# Patient Record
Sex: Male | Born: 2000 | Race: White | Hispanic: No | Marital: Single | Smoking: Never smoker
Health system: Southern US, Community
[De-identification: ages and names within clinical notes are randomized; demographics above are authoritative.]

---
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
## 2023-03-13 ENCOUNTER — Ambulatory Visit: Payer: PRIVATE HEALTH INSURANCE | Attending: Cardiology | Admitting: Cardiology

## 2023-03-13 ENCOUNTER — Encounter: Payer: Self-pay | Admitting: Cardiology

## 2023-03-13 VITALS — BP 118/60 | HR 77 | Ht 72.0 in | Wt 161.0 lb

## 2023-03-13 DIAGNOSIS — R0602 Shortness of breath: Secondary | ICD-10-CM

## 2023-03-13 DIAGNOSIS — R000 Tachycardia, unspecified: Secondary | ICD-10-CM

## 2023-03-13 NOTE — Patient Instructions (Signed)
    Testing/Procedures:  Your physician has recommended that you have a cardiopulmonary stress test (CPX). CPX testing is a non-invasive measurement of heart and lung function. It replaces a traditional treadmill stress test. This type of test provides a tremendous amount of information that relates not only to your present condition but also for future outcomes. This test combines measurements of you ventilation, respiratory gas exchange in the lungs, electrocardiogram (EKG), blood pressure and physical response before, during, and following an exercise protocol.    Follow-Up: At [facility], you and your health needs are our priority.  As part of our continuing mission to provide you with exceptional heart care, we have created designated Provider Care Teams.  These Care Teams include your primary Cardiologist (physician) and Advanced Practice Providers (APPs -  Physician Assistants and Nurse Practitioners) who all work together to provide you with the care you need, when you need it.  We recommend signing up for the patient portal called "MyChart".  Sign up information is provided on this After Visit Summary.  MyChart is used to connect with patients for Virtual Visits (Telemedicine).  Patients are able to view lab/test results, encounter notes, upcoming appointments, etc.  Non-urgent messages can be sent to your provider as well.   To learn more about what you can do with MyChart, go to [email or web address].    Your next appointment:

## 2023-03-13 NOTE — Progress Notes (Signed)
Cardiology Office Note   Date:  03/13/2023   ID:  Patrick Schultz, DOB 2000-09-04, MRN [id number]  PCP:  No primary care provider on file.  Cardiologist:   None Referring:  No ref. provider found  Chief Complaint  Patient presents with   Shortness of Breath   Tachycardia      History of Present Illness: Patrick Schultz is a 22 y.o. male who presents for evaluation of tachycardia.  He is a Tree surgeon.  He has been athletic all his young life playing 5-6 sports.  He has never had any cardiac issues.  He was noted recently during some testing at school to have a high resting heart rate in the 90s to 100s.  He started recording his heart rate on his watch and noticed that when he was doing something with soccer his heart rate would be up in the 200s.  He does have some shortness of breath with activity.  He feels like he cannot get a deep breath at times.  He might have some shortness of breath occasionally at rest.  He feels like it strange shocking sensation in his chest occasionally possibly related related to work time shortness of breath but these are fleeting and typically unprovoked.  He has not had any syncope.  He has not had any skipping heartbeats.  He is able to do his activities but he is about to get back to training for college needs to be assessed for this.   History reviewed. No pertinent past medical history.  No past medical history  History reviewed. No pertinent surgical history.  No past surgical history   No current outpatient medications on file.   No current facility-administered medications for this visit.    Allergies:   Sulfa antibiotics and Sulfamethoxazole    Social History:  The patient  reports that he has never smoked. He has never used smokeless tobacco.   Family History:  The patient's family history is not on file.   Family history noncontributory.   ROS:  Please see the history of present illness.   Otherwise, review of  systems are positive for none.   All other systems are reviewed and negative.    PHYSICAL EXAM: VS:  BP 118/60 (BP Location: Right Arm, Patient Position: Sitting)   Pulse 77   Ht 6' (1.829 m)   Wt 161 lb (73 kg)   SpO2 98%   BMI 21.84 kg/m  , BMI Body mass index is 21.84 kg/m. GENERAL:  Well appearing HEENT:  Pupils equal round and reactive, fundi not visualized, oral mucosa unremarkable NECK:  No jugular venous distention, waveform within normal limits, carotid upstroke brisk and symmetric, no bruits, no thyromegaly LYMPHATICS:  No cervical, inguinal adenopathy LUNGS:  Clear to auscultation bilaterally BACK:  No CVA tenderness CHEST:  Unremarkable HEART:  PMI not displaced or sustained,S1 and S2 within normal limits, no S3, no S4, no clicks, no rubs, no murmurs ABD:  Flat, positive bowel sounds normal in frequency in pitch, no bruits, no rebound, no guarding, no midline pulsatile mass, no hepatomegaly, no splenomegaly EXT:  2 plus pulses throughout, no edema, no cyanosis no clubbing SKIN:  No rashes no nodules NEURO:  Cranial nerves II through XII grossly intact, motor grossly intact throughout [facility]:  Cognitively intact, oriented to person place and time    EKG:  EKG Interpretation Date/Time:  Thursday March 13 2023 10:43:13 EDT Ventricular Rate:  77 PR Interval:  154 QRS Duration:  96 QT Interval:  358 QTC Calculation: 405 R Axis:   83  Text Interpretation: Normal sinus rhythm Normal ECG No previous ECGs available Confirmed by Rollene Rotunda ([id number]) on 03/13/2023 11:18:01 AM     Recent Labs: No results found for requested labs within last 365 days.    Lipid Panel No results found for: "CHOL", "TRIG", "HDL", "CHOLHDL", "VLDL", "LDLCALC", "LDLDIRECT"    Wt Readings from Last 3 Encounters:  03/13/23 161 lb (73 kg)  08/28/16 132 lb (59.9 kg) (59%, Z= 0.22)*   * Growth percentiles are based on CDC (Boys, 2-20 Years) data.      Other studies Reviewed: Additional  studies/ records that were reviewed today include: Data from Watch. Review of the above records demonstrates:  Please see elsewhere in the note.     ASSESSMENT AND PLAN:  SOB: The patient has some tachycardia and shortness of breath with exercise.  I suspect this is probably physiologic but I like to make sure he does not have a dysrhythmia or an exercise-induced asthma.  I will send him for cardiopulmonary stress testing.  Of note I did see that his TSH was normal.  He is not anemic.  Electrolytes were unremarkable.  He this is normal he will follow-up with his own monitoring on his Watch.  His exam and baseline EKG are unremarkable and suspect a structurally normal heart.     Current medicines are reviewed at length with the patient today.  The patient does not have concerns regarding medicines.  The following changes have been made:  no change  Labs/ tests ordered today include:   Orders Placed This Encounter  Procedures   Cardiopulmonary exercise test   EKG 12-Lead     Disposition:   FU with me as needed based on the results of the above.     Signed, Rollene Rotunda, MD  03/13/2023 12:33 PM    [facility]

## 2023-04-02 ENCOUNTER — Telehealth (HOSPITAL_COMMUNITY): Payer: Self-pay | Admitting: *Deleted

## 2023-04-02 NOTE — Telephone Encounter (Signed)
Left voicemail to remind patient of CPX appointment on Wednesday 04/09/23 at 11:00. Provided patient with call back [phone] if cancellation/reschedule is necessary.       Reggy Eye, MS, ACSM, [facility] Clinical Exercise Physiologist/ Health and Wellness Coach

## 2023-04-08 ENCOUNTER — Encounter (HOSPITAL_COMMUNITY): Payer: PRIVATE HEALTH INSURANCE

## 2023-04-09 ENCOUNTER — Ambulatory Visit (HOSPITAL_COMMUNITY): Payer: Commercial Managed Care - PPO | Attending: Cardiology

## 2023-04-09 DIAGNOSIS — R0602 Shortness of breath: Secondary | ICD-10-CM | POA: Diagnosis present

## 2023-04-09 DIAGNOSIS — R000 Tachycardia, unspecified: Secondary | ICD-10-CM

## 2023-04-09 DIAGNOSIS — R5383 Other fatigue: Secondary | ICD-10-CM | POA: Diagnosis not present

## 2024-09-01 ENCOUNTER — Other Ambulatory Visit: Payer: Self-pay

## 2024-09-01 ENCOUNTER — Emergency Department (HOSPITAL_BASED_OUTPATIENT_CLINIC_OR_DEPARTMENT_OTHER)

## 2024-09-01 ENCOUNTER — Emergency Department (HOSPITAL_BASED_OUTPATIENT_CLINIC_OR_DEPARTMENT_OTHER)
Admission: EM | Admit: 2024-09-01 | Discharge: 2024-09-01 | Disposition: A | Discharge status: Home / Self Care | Attending: Emergency Medicine | Admitting: Emergency Medicine

## 2024-09-01 ENCOUNTER — Encounter (HOSPITAL_BASED_OUTPATIENT_CLINIC_OR_DEPARTMENT_OTHER): Payer: Self-pay | Admitting: Emergency Medicine

## 2024-09-01 DIAGNOSIS — W000XXA Fall on same level due to ice and snow, initial encounter: Secondary | ICD-10-CM | POA: Diagnosis not present

## 2024-09-01 DIAGNOSIS — S8391XA Sprain of unspecified site of right knee, initial encounter: Secondary | ICD-10-CM

## 2024-09-01 DIAGNOSIS — M25561 Pain in right knee: Secondary | ICD-10-CM | POA: Diagnosis present

## 2024-09-01 DIAGNOSIS — M25461 Effusion, right knee: Secondary | ICD-10-CM | POA: Insufficient documentation

## 2024-09-01 NOTE — ED Triage Notes (Signed)
 Pt via pov from home after falling on the ice earlier today. Pt reports prior ACL injury in September 2025. He heard the same sound and felt the same injury on his right leg today. Pt states he is able to bear weight (full weight bearing is very painful). Pt a&o x 4; nad noted.

## 2024-09-01 NOTE — Discharge Instructions (Signed)
 The x-ray today was normal but put your brace on that you have at home and wear that continually until you follow-up with Dr. Rubie.  Take Tylenol  and ibuprofen as needed for pain.

## 2024-09-01 NOTE — ED Provider Notes (Signed)
 " Patrick Schultz EMERGENCY DEPARTMENT AT [facility] Provider Note   CSN: [id number] Arrival date & time: 09/01/24  2048     Patient presents with: Patrick Schultz   Patrick Schultz is a 24 y.o. male.   Imaging is patient is a 24 year old male presenting today with complaints of right knee pain.  Patient has history of ACL repair after an injury and tear in the fall.  Today he was going out on a date and states that he was stepping up on the sidewalk when his left foot started sliding on the ice and his right knee bent and he felt a pop.  He immediately started feeling pain in the knee similar to when he tore his ACL in the past.  He has been able to walk on it and it has felt stiff but not particularly unstable.  This happened approximately an hour ago and he has not noticed significant swelling of the knee.  Denies any head injury or pain anywhere else.  The history is provided by the patient.  Fall       Prior to Admission medications  Not on File    Allergies: Penicillins, Sulfa antibiotics, and Sulfamethoxazole    Review of Systems  Updated Vital Signs BP (!) 161/95   Pulse (!) 103   Temp 98.7 F (37.1 C)   Resp 20   Ht 6' (1.829 m)   Wt 77.1 kg   SpO2 100%   BMI 23.06 kg/m   Physical Exam Vitals reviewed.  Cardiovascular:     Pulses: Normal pulses.  Musculoskeletal:        General: Tenderness present.     Right knee: Normal range of motion. Tenderness present over the lateral joint line and PCL.     Comments: Patient does not have any meniscal tenderness.  Midline surgical scar is well-healed.  Slight swelling noted.  Patient can flex to 90 degrees.  Neurological:     Mental Status: He is alert. Mental status is at baseline.     (all labs ordered are listed, but only abnormal results are displayed) Labs Reviewed - No data to display  EKG: None  Radiology: DG Knee Complete 4 Views Right Result Date: 09/01/2024 EXAM: 4 OR MORE VIEW(S) XRAY OF THE RIGHT  KNEE 09/01/2024 10:20:00 PM COMPARISON: Comparison with MRI right knee 03/24/2024. CLINICAL HISTORY: Pain after fall. FINDINGS: BONES AND JOINTS: Postoperative changes consistent with previous anterior cruciate ligament repair. No acute fracture. No dislocation. No malalignment. No focal bone lesion or bone destruction. The bone cortex appears intact. No significant joint effusion. SOFT TISSUES: Foreign material projected over the posterolateral right knee, likely clothing. IMPRESSION: 1. No acute fracture or dislocation. 2. No significant knee joint effusion. 3. Postoperative changes consistent with previous anterior cruciate ligament repair. Electronically signed by: Elsie Gravely MD 09/01/2024 10:26 PM EST RP Workstation: [id number]     Procedures   Medications Ordered in the ED - No data to display                                  Medical Decision Making Amount and/or Complexity of Data Reviewed Radiology: ordered and independent interpretation performed. Decision-making details documented in ED Course.   Patient presenting today after a slip and fall on ice with injury to the right knee.  He reports feeling a pop when this occurred and he has had ongoing pain.  This is an light of  recent surgery this fall for an ACL repair.  Concern for recurrent injury of the same but we will do an x-ray to ensure no bony fractures.  Patient has no other area of injury at this time.  10:29 PM I have independently visualized and interpreted pt's images today.  For acute for sure.  All the findings were discussed with the patient.  He will call Dr. Conrado office tomorrow for further care.  He will continue to wear the brace he already has at home.      Final diagnoses:  None    ED Discharge Orders     None          Doretha Folks, MD 09/01/24 2242  "
